# Patient Record
Sex: Male | Born: 1990 | Race: White | Hispanic: No | Marital: Single | Smoking: Never smoker
Health system: Southern US, Community
[De-identification: ages and names within clinical notes are randomized; demographics above are authoritative.]

## PROBLEM LIST (undated history)

## (undated) HISTORY — PX: KNEE SURGERY: SHX244

---
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
## 2011-05-30 ENCOUNTER — Ambulatory Visit: Payer: 59 | Admitting: Family Medicine

## 2011-05-30 ENCOUNTER — Ambulatory Visit: Payer: 59

## 2011-05-30 VITALS — BP 116/67 | HR 88 | Temp 98.5°F | Resp 18 | Ht 68.5 in | Wt 204.0 lb

## 2011-05-30 DIAGNOSIS — K5900 Constipation, unspecified: Secondary | ICD-10-CM

## 2011-05-30 DIAGNOSIS — J029 Acute pharyngitis, unspecified: Secondary | ICD-10-CM

## 2011-05-30 DIAGNOSIS — R1084 Generalized abdominal pain: Secondary | ICD-10-CM

## 2011-05-30 DIAGNOSIS — K219 Gastro-esophageal reflux disease without esophagitis: Secondary | ICD-10-CM

## 2011-05-30 DIAGNOSIS — R1310 Dysphagia, unspecified: Secondary | ICD-10-CM

## 2011-05-30 DIAGNOSIS — R110 Nausea: Secondary | ICD-10-CM

## 2011-05-30 LAB — POCT CBC
Granulocyte percent: 65.3 %G (ref 37–80)
HCT, POC: 45.1 % (ref 43.5–53.7)
Hemoglobin: 15 g/dL (ref 14.1–18.1)
Lymph, poc: 2.6 (ref 0.6–3.4)
MCH, POC: 30.7 pg (ref 27–31.2)
MCHC: 33.3 g/dL (ref 31.8–35.4)
MCV: 92.4 fL (ref 80–97)
MID (cbc): 0.5 (ref 0–0.9)
MPV: 8.2 fL (ref 0–99.8)
POC Granulocyte: 5.8 (ref 2–6.9)
POC LYMPH PERCENT: 28.9 %L (ref 10–50)
POC MID %: 5.8 %M (ref 0–12)
Platelet Count, POC: 337 10*3/uL (ref 142–424)
RBC: 4.88 M/uL (ref 4.69–6.13)
RDW, POC: 13 %
WBC: 8.9 10*3/uL (ref 4.6–10.2)

## 2011-05-30 LAB — POCT RAPID STREP A (OFFICE): Rapid Strep A Screen: NEGATIVE

## 2011-05-30 LAB — POCT URINALYSIS DIPSTICK
Bilirubin, UA: NEGATIVE
Blood, UA: NEGATIVE
Glucose, UA: NEGATIVE
Ketones, UA: NEGATIVE
Leukocytes, UA: NEGATIVE
Nitrite, UA: NEGATIVE
Protein, UA: NEGATIVE
Spec Grav, UA: 1.025
Urobilinogen, UA: 1
pH, UA: 7

## 2011-05-30 LAB — POCT UA - MICROSCOPIC ONLY
Bacteria, U Microscopic: NEGATIVE
Casts, Ur, LPF, POC: NEGATIVE
Crystals, Ur, HPF, POC: NEGATIVE
Epithelial cells, urine per micros: NEGATIVE
Yeast, UA: NEGATIVE

## 2011-05-30 MED ORDER — OMEPRAZOLE 40 MG PO CPDR: 40.0000 mg | DELAYED_RELEASE_CAPSULE | Freq: Every day | ORAL | Status: DC

## 2011-05-30 NOTE — Patient Instructions (Addendum)
Take MiraLax as instructed until the bowels get on the loose side.  If you continue to not feel any better over the next week or 10 days, or are. worse at any time, return to the office.  If the throat is continuing to bother you try taking Claritin or Allegra.  Constipation in Adults Constipation is having fewer than 2 bowel movements per week. Usually, the stools are hard. As we grow older, constipation is more common. If you try to fix constipation with laxatives, the problem may get worse. This is because laxatives taken over a long period of time make the colon muscles weaker. A low-fiber diet, not taking in enough fluids, and taking some medicines may make these problems worse. MEDICATIONS THAT MAY CAUSE CONSTIPATION  Water pills (diuretics).   Calcium channel blockers (used to control blood pressure and for the heart).   Certain pain medicines (narcotics).   Anticholinergics.   Anti-inflammatory agents.   Antacids that contain aluminum.  DISEASES THAT CONTRIBUTE TO CONSTIPATION  Diabetes.   Parkinson's disease.   Dementia.   Stroke.   Depression.   Illnesses that cause problems with salt and water metabolism.  HOME CARE INSTRUCTIONS   Constipation is usually best cared for without medicines. Increasing dietary fiber and eating more fruits and vegetables is the best way to manage constipation.   Slowly increase fiber intake to 25 to 38 grams per day. Whole grains, fruits, vegetables, and legumes are good sources of fiber. A dietitian can further help you incorporate high-fiber foods into your diet.   Drink enough water and fluids to keep your urine clear or pale yellow.   A fiber supplement may be added to your diet if you cannot get enough fiber from foods.   Increasing your activities also helps improve regularity.   Suppositories, as suggested by your caregiver, will also help. If you are using antacids, such as aluminum or calcium containing products, it will  be helpful to switch to products containing magnesium if your caregiver says it is okay.   If you have been given a liquid injection (enema) today, this is only a temporary measure. It should not be relied on for treatment of longstanding (chronic) constipation.   Stronger measures, such as magnesium sulfate, should be avoided if possible. This may cause uncontrollable diarrhea. Using magnesium sulfate may not allow you time to make it to the bathroom.  SEEK IMMEDIATE MEDICAL CARE IF:   There is bright red blood in the stool.   The constipation stays for more than 4 days.   There is belly (abdominal) or rectal pain.   You do not seem to be getting better.   You have any questions or concerns.  MAKE SURE YOU:   Understand these instructions.   Will watch your condition.   Will get help right away if you are not doing well or get worse.  Document Released: 11/02/2003 Document Revised: 01/23/2011 Document Reviewed: 01/07/2011 [facility] Patient Information 2012 [place], [place].

## 2011-05-30 NOTE — Progress Notes (Signed)
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 It is a generalized with episodic flares discomfort of the whole abdomen. He gets nauseated some. He his bowels generally acts normally. He saw some blood back in about September. Has not had any more of that. He eats a fast food diet primarily. He does get some exercise with walking. He is not sexually involved. Has no cardiorespiratory symptoms. No GU symptoms. During the past week he has had a somewhat different problem with just difficulty with feeling bloated after eating and a somewhat nonspecific discomfort. He has had some soreness in the right side of his throat over the last week it makes it hard to swallow. Mild erythema right tonsil  Objective: White male in no acute distress at this time chest clear. Heart regular. Abdomen had normal bowel sounds soft without organomegaly masses or discrete tenderness. He has a vague discomfort and tenderness in the left and mid abdomen. Although there were no masses on palpation the abdomen does feel full.  Assessment: Abdominal pain, etiology unclear Sore throat  Plan: CBC, CMeT., U A., abdominal x-ray, strep screen  Results for orders placed in visit on 05/30/11  POCT CBC      Component Value Range   WBC 8.9  4.6 - 10.2 (K/uL)   Lymph, poc 2.6  0.6 - 3.4    POC LYMPH PERCENT 28.9  10 - 50 (%L)   MID (cbc) 0.5  0 - 0.9    POC MID % 5.8  0 - 12 (%M)   POC Granulocyte 5.8  2 - 6.9    Granulocyte percent 65.3  37 - 80 (%G)   RBC 4.88  4.69 - 6.13 (M/uL)   Hemoglobin 15.0  14.1 - 18.1 (g/dL)   HCT, POC 16.1  09.6 - 53.7 (%)   MCV 92.4  80 - 97 (fL)   MCH, POC 30.7  27 - 31.2 (pg)   MCHC 33.3  31.8 - 35.4 (g/dL)   RDW, POC 04.5     Platelet Count, POC 337  142 - 424 (K/uL)   MPV 8.2  0 - 99.8 (fL)  POCT URINALYSIS DIPSTICK      Component Value Range   Color, UA yellow     Clarity, UA clear     Glucose, UA neg     Bilirubin, UA  neg     Ketones, UA neg     Spec Grav, UA 1.025     Blood, UA neg     pH, UA 7.0     Protein, UA neg     Urobilinogen, UA 1.0     Nitrite, UA neg     Leukocytes, UA Negative    POCT UA - MICROSCOPIC ONLY      Component Value Range   WBC, Ur, HPF, POC 0-2     RBC, urine, microscopic 0-1     Bacteria, U Microscopic neg     Mucus, UA moderate     Epithelial cells, urine per micros neg     Crystals, Ur, HPF, POC neg     Casts, Ur, LPF, POC neg     Yeast, UA neg    POCT RAPID STREP A (OFFICE)      Component Value Range   Rapid Strep A Screen Negative  Negative    [facility] reading (PRIMARY) by  Dr. Alwyn Ren Nonspecific abdomen  .

## 2011-05-31 LAB — COMPREHENSIVE METABOLIC PANEL
ALT: 17 U/L (ref 0–53)
AST: 16 U/L (ref 0–37)
Albumin: 4.6 g/dL (ref 3.5–5.2)
Alkaline Phosphatase: 97 U/L (ref 39–117)
BUN: 8 mg/dL (ref 6–23)
CO2: 27 mEq/L (ref 19–32)
Calcium: 9.9 mg/dL (ref 8.4–10.5)
Chloride: 106 mEq/L (ref 96–112)
Creat: 0.97 mg/dL (ref 0.50–1.35)
Glucose, Bld: 90 mg/dL (ref 70–99)
Potassium: 4 mEq/L (ref 3.5–5.3)
Sodium: 142 mEq/L (ref 135–145)
Total Bilirubin: 0.4 mg/dL (ref 0.3–1.2)
Total Protein: 7.4 g/dL (ref 6.0–8.3)

## 2011-06-03 ENCOUNTER — Encounter: Payer: Self-pay | Admitting: *Deleted

## 2012-06-30 ENCOUNTER — Ambulatory Visit: Payer: 59 | Admitting: Family Medicine

## 2012-06-30 VITALS — BP 122/80 | HR 69 | Temp 98.2°F | Resp 12 | Ht 69.0 in | Wt 205.0 lb

## 2012-06-30 DIAGNOSIS — M25561 Pain in right knee: Secondary | ICD-10-CM

## 2012-06-30 DIAGNOSIS — M79609 Pain in unspecified limb: Secondary | ICD-10-CM

## 2012-06-30 NOTE — Progress Notes (Signed)
22 year old man with one year of right knee pain following injury playing ultimate Frisbee. He was initially evaluated and told that the knee would resolve without further intervention, but he states continues to have pain particularly when bending the knee and activities such as going up and down ladders. 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  Objective: Patient has full range of motion although he is cautious about bending his knee. He has a moderate effusion of the right knee, no tenderness, no ecchymosis, moderate anterior drawer laxity, negative grind test Aref assessment: I suspect internal derangement right knee continues to be further imaged to determine exact of her structures are involved.  Plan: MRI right knee   Signed, Elvina Sidle

## 2012-06-30 NOTE — Patient Instructions (Signed)
We are going  To get an MRI to better image the inside of the right  knee

## 2012-07-01 ENCOUNTER — Telehealth: Payer: Self-pay

## 2012-07-01 NOTE — Telephone Encounter (Signed)
Authorization -- [id number] -- for MRI R knee

## 2012-07-01 NOTE — Telephone Encounter (Signed)
Peer-to-peer needed for pt: MRI lower ext joint w/o contrast, case #[id number]. Subscriber ID# [id number], phone # [phone], opt 3. Needs to be done by 07/03/12.

## 2013-04-09 ENCOUNTER — Ambulatory Visit: Payer: 59 | Admitting: Emergency Medicine

## 2013-04-09 VITALS — BP 110/72 | HR 70 | Temp 97.9°F | Resp 18 | Ht 69.5 in | Wt 194.0 lb

## 2013-04-09 DIAGNOSIS — J018 Other acute sinusitis: Secondary | ICD-10-CM

## 2013-04-09 MED ORDER — AMOXICILLIN-POT CLAVULANATE 875-125 MG PO TABS: 1.0000 | ORAL_TABLET | Freq: Two times a day (BID) | ORAL | Status: DC

## 2013-04-09 MED ORDER — PSEUDOEPHEDRINE-GUAIFENESIN ER 60-600 MG PO TB12: 1.0000 | ORAL_TABLET | Freq: Two times a day (BID) | ORAL | Status: DC

## 2013-04-09 NOTE — Progress Notes (Signed)
[facility] [address], [place] [address] [phone]  Date:  04/09/2013   Name:  Hayden HansenSean M Brown   DOB:  12/24/1990   MRN:  [id number]  PCP:  No primary provider on file.    Chief Complaint: Nasal Congestion, Headache, Dizziness and dry mouth   History of Present Illness:  Hayden HansenSean M Brown is a 23 y.o. very pleasant male patient who presents with the following:  Ill since Wednesday with nasal congestion and purulent drainage.  Has a sore throat and a cough productive of purulent sputum. No fever or chills.  Has a frontal headache and no neuro or visual symptoms.  Cough worse at night.  No improvement with over the counter medications or other home remedies. Denies other complaint or health concern today.   There are no active problems to display for this patient.   History reviewed. No pertinent past medical history.  Past Surgical History  Procedure Laterality Date  . Knee surgery Right     History  Substance Use Topics  . Smoking status: Never Smoker   . Smokeless tobacco: Not on file  . Alcohol Use: No    History reviewed. No pertinent family history.  No Known Allergies  Medication list has been reviewed and updated.  Current Outpatient Prescriptions on File Prior to Visit  Medication Sig Dispense Refill  . omeprazole (PRILOSEC) 40 MG capsule Take 1 capsule (40 mg total) by mouth daily.  30 capsule  3   No current facility-administered medications on file prior to visit.    Review of Systems:  As per HPI, otherwise negative.    Physical Examination: Filed Vitals:   04/09/13 1357  BP: 110/72  Pulse: 70  Temp: 97.9 F (36.6 C)  Resp: 18   Filed Vitals:   04/09/13 1357  Height: 5' 9.5" (1.765 m)  Weight: 194 lb (87.998 kg)   Body mass index is 28.25 kg/(m^2). Ideal Body Weight: Weight in (lb) to have BMI = 25: 171.4  GEN: WDWN, NAD, Non-toxic, A & O x 3 HEENT: Atraumatic, Normocephalic. Neck supple. No masses, No LAD. Ears  and Nose: No external deformity. CV: RRR, No M/G/R. No JVD. No thrill. No extra heart sounds. PULM: CTA B, no wheezes, crackles, rhonchi. No retractions. No resp. distress. No accessory muscle use. ABD: S, NT, ND, +BS. No rebound. No HSM. EXTR: No c/c/e NEURO Normal gait.  PSYCH: Normally interactive. Conversant. Not depressed or anxious appearing.  Calm demeanor.    Assessment and Plan: Sinusitis Augmentin  mucinex d  Signed,  Phillips OdorJeffery Christabel Camire, MD

## 2013-04-09 NOTE — Patient Instructions (Signed)

## 2013-09-07 ENCOUNTER — Ambulatory Visit (INDEPENDENT_AMBULATORY_CARE_PROVIDER_SITE_OTHER): Payer: 59 | Admitting: Family Medicine

## 2013-09-07 VITALS — BP 102/70 | HR 56 | Temp 98.1°F | Resp 16 | Ht 68.0 in | Wt 187.6 lb

## 2013-09-07 DIAGNOSIS — L299 Pruritus, unspecified: Secondary | ICD-10-CM

## 2013-09-07 MED ORDER — PREDNISONE 20 MG PO TABS: ORAL_TABLET | ORAL | Status: DC

## 2013-09-07 NOTE — Progress Notes (Signed)
[facility] [address], [place] [address] [phone]  Date:  09/07/2013   Name:  Hayden Brown   DOB:  10/09/1990   MRN:  [id number]  PCP:  No PCP Per Patient    Chief Complaint: Rash   History of Present Illness:  Hayden Brown is a 23 y.o. very pleasant male patient who presents with the following:  He is here today with itching for the last 5-6 days.  He did do some work on a roof the day prior to this starting- he got some grit from the shingles on his skin but washed it off carefully. Otherwise he is not aware of any exposure to new foods, medications or other substances.  He otherwise feels well and is generally healthy.  He has not tried any medication for his itching.  He has noted some rash but not much   There are no active problems to display for this patient.   History reviewed. No pertinent past medical history.  Past Surgical History  Procedure Laterality Date  . Knee surgery Right     History  Substance Use Topics  . Smoking status: Never Smoker   . Smokeless tobacco: Not on file  . Alcohol Use: No    History reviewed. No pertinent family history.  No Known Allergies  Medication list has been reviewed and updated.  Current Outpatient Prescriptions on File Prior to Visit  Medication Sig Dispense Refill  . amoxicillin-clavulanate (AUGMENTIN) 875-125 MG per tablet Take 1 tablet by mouth 2 (two) times daily.  20 tablet  0  . omeprazole (PRILOSEC) 40 MG capsule Take 1 capsule (40 mg total) by mouth daily.  30 capsule  3  . pseudoephedrine-guaifenesin (MUCINEX D) 60-600 MG per tablet Take 1 tablet by mouth every 12 (twelve) hours.  18 tablet  0   No current facility-administered medications on file prior to visit.    Review of Systems:  As per HPI- otherwise negative.   Physical Examination: Filed Vitals:   09/07/13 1513  BP: 102/70  Pulse: 56  Temp: 98.1 F (36.7 C)  Resp: 16   Filed Vitals:   09/07/13 1513  Height:  5\' 8"  (1.727 m)  Weight: 187 lb 9.6 oz (85.095 kg)   Body mass index is 28.53 kg/(m^2). Ideal Body Weight: Weight in (lb) to have BMI = 25: 164.1  GEN: WDWN, NAD, Non-toxic, A & O x 3, looks well HEENT: Atraumatic, Normocephalic. Neck supple. No masses, No LAD.  No oral lesions  Ears and Nose: No external deformity. CV: RRR, No M/G/R. No JVD. No thrill. No extra heart sounds. PULM: CTA B, no wheezes, crackles, rhonchi. No retractions. No resp. distress. No accessory muscle use. ABD: S, NT, ND EXTR: No c/c/e NEURO Normal gait.  PSYCH: Normally interactive. Conversant. Not depressed or anxious appearing.  Calm demeanor.  He has a mild rash on his chest that appears irritated; discrete papules which blanche easily.     Assessment and Plan: Itching - Plan: predniSONE (DELTASONE) 20 MG tablet, Comprehensive metabolic panel  Probably allergic dermatitis.  Will treat with a short course of prednisone, check CMP.  Antihistamines as needed.  Follow- up if not better See patient instructions for more details.     Signed Abbe AmsterdamJessica Copland, MD

## 2013-09-07 NOTE — Patient Instructions (Signed)
We are going to treat your itching with prednisone- use as directed for 6 days.  You might try claritin during the day, and benadryl at night/ when you do not need to drive.  I will be in touch with your labs asap- let me know if you are not improved in 36- 48 hours/

## 2013-09-08 ENCOUNTER — Encounter: Payer: Self-pay | Admitting: Family Medicine

## 2013-09-08 LAB — COMPREHENSIVE METABOLIC PANEL
ALK PHOS: 99 U/L (ref 39–117)
ALT: 16 U/L (ref 0–53)
AST: 14 U/L (ref 0–37)
Albumin: 4.8 g/dL (ref 3.5–5.2)
BILIRUBIN TOTAL: 0.7 mg/dL (ref 0.2–1.2)
BUN: 12 mg/dL (ref 6–23)
CO2: 25 meq/L (ref 19–32)
Calcium: 9.9 mg/dL (ref 8.4–10.5)
Chloride: 101 mEq/L (ref 96–112)
Creat: 0.84 mg/dL (ref 0.50–1.35)
Glucose, Bld: 87 mg/dL (ref 70–99)
Potassium: 4.2 mEq/L (ref 3.5–5.3)
SODIUM: 136 meq/L (ref 135–145)
TOTAL PROTEIN: 7.7 g/dL (ref 6.0–8.3)

## 2014-06-21 ENCOUNTER — Ambulatory Visit (INDEPENDENT_AMBULATORY_CARE_PROVIDER_SITE_OTHER): Payer: 59

## 2014-06-21 ENCOUNTER — Ambulatory Visit (INDEPENDENT_AMBULATORY_CARE_PROVIDER_SITE_OTHER): Payer: 59 | Admitting: Internal Medicine

## 2014-06-21 ENCOUNTER — Telehealth: Payer: Self-pay | Admitting: Radiology

## 2014-06-21 VITALS — BP 106/58 | HR 74 | Temp 97.8°F | Resp 15 | Ht 69.25 in | Wt 195.4 lb

## 2014-06-21 DIAGNOSIS — R070 Pain in throat: Secondary | ICD-10-CM | POA: Diagnosis not present

## 2014-06-21 DIAGNOSIS — R05 Cough: Secondary | ICD-10-CM

## 2014-06-21 DIAGNOSIS — R0781 Pleurodynia: Secondary | ICD-10-CM

## 2014-06-21 DIAGNOSIS — J3089 Other allergic rhinitis: Secondary | ICD-10-CM

## 2014-06-21 DIAGNOSIS — R059 Cough, unspecified: Secondary | ICD-10-CM

## 2014-06-21 LAB — POCT CBC
Granulocyte percent: 58.7 %G (ref 37–80)
HCT, POC: 46.4 % (ref 43.5–53.7)
HEMOGLOBIN: 14.7 g/dL (ref 14.1–18.1)
LYMPH, POC: 2.1 (ref 0.6–3.4)
MCH, POC: 29.5 pg (ref 27–31.2)
MCHC: 31.8 g/dL (ref 31.8–35.4)
MCV: 92.9 fL (ref 80–97)
MID (cbc): 0.5 (ref 0–0.9)
MPV: 7.4 fL (ref 0–99.8)
POC Granulocyte: 3.6 (ref 2–6.9)
POC LYMPH PERCENT: 34 %L (ref 10–50)
POC MID %: 7.3 %M (ref 0–12)
Platelet Count, POC: 300 10*3/uL (ref 142–424)
RBC: 4.99 M/uL (ref 4.69–6.13)
RDW, POC: 13.6 %
WBC: 6.2 10*3/uL (ref 4.6–10.2)

## 2014-06-21 MED ORDER — HYDROCODONE-ACETAMINOPHEN 7.5-325 MG/15ML PO SOLN: 10.0000 mL | Freq: Four times a day (QID) | ORAL | Status: DC | PRN

## 2014-06-21 MED ORDER — CETIRIZINE HCL 10 MG PO TABS: 10.0000 mg | ORAL_TABLET | Freq: Every day | ORAL | Status: DC

## 2014-06-21 MED ORDER — AZITHROMYCIN 500 MG PO TABS: 500.0000 mg | ORAL_TABLET | Freq: Every day | ORAL | Status: DC

## 2014-06-21 NOTE — Patient Instructions (Addendum)
Allergic Rhinitis Allergic rhinitis is when the mucous membranes in the nose respond to allergens. Allergens are particles in the air that cause your body to have an allergic reaction. This causes you to release allergic antibodies. Through a chain of events, these eventually cause you to release histamine into the blood stream. Although meant to protect the body, it is this release of histamine that causes your discomfort, such as frequent sneezing, congestion, and an itchy, runny nose.  CAUSES  Seasonal allergic rhinitis (hay fever) is caused by pollen allergens that may come from grasses, trees, and weeds. Year-round allergic rhinitis (perennial allergic rhinitis) is caused by allergens such as house dust mites, pet dander, and mold spores.  SYMPTOMS   Nasal stuffiness (congestion).  Itchy, runny nose with sneezing and tearing of the eyes. DIAGNOSIS  Your health care provider can help you determine the allergen or allergens that trigger your symptoms. If you and your health care provider are unable to determine the allergen, skin or blood testing may be used. TREATMENT  Allergic rhinitis does not have a cure, but it can be controlled by:  Medicines and allergy shots (immunotherapy).  Avoiding the allergen. Hay fever may often be treated with antihistamines in pill or nasal spray forms. Antihistamines block the effects of histamine. There are over-the-counter medicines that may help with nasal congestion and swelling around the eyes. Check with your health care provider before taking or giving this medicine.  If avoiding the allergen or the medicine prescribed do not work, there are many new medicines your health care provider can prescribe. Stronger medicine may be used if initial measures are ineffective. Desensitizing injections can be used if medicine and avoidance does not work. Desensitization is when a patient is given ongoing shots until the body becomes less sensitive to the allergen.  Make sure you follow up with your health care provider if problems continue. HOME CARE INSTRUCTIONS It is not possible to completely avoid allergens, but you can reduce your symptoms by taking steps to limit your exposure to them. It helps to know exactly what you are allergic to so that you can avoid your specific triggers. SEEK MEDICAL CARE IF:   You have a fever.  You develop a cough that does not stop easily (persistent).  You have shortness of breath.  You start wheezing.  Symptoms interfere with normal daily activities. Document Released: 10/29/2000 Document Revised: 02/08/2013 Document Reviewed: 10/11/2012 [facility] Patient Information 2015 [place], [place]. This information is not intended to replace advice given to you by your health care provider. Make sure you discuss any questions you have with your health care provider. Chest Pain (Nonspecific) It is often hard to give a specific diagnosis for the cause of chest pain. There is always a chance that your pain could be related to something serious, such as a heart attack or a blood clot in the lungs. You need to follow up with your health care provider for further evaluation. CAUSES   Heartburn.  Pneumonia or bronchitis.  Anxiety or stress.  Inflammation around your heart (pericarditis) or lung (pleuritis or pleurisy).  A blood clot in the lung.  A collapsed lung (pneumothorax). It can develop suddenly on its own (spontaneous pneumothorax) or from trauma to the chest.  Shingles infection (herpes zoster virus). The chest wall is composed of bones, muscles, and cartilage. Any of these can be the source of the pain.  The bones can be bruised by injury.  The muscles or cartilage can be strained  by coughing or overwork.  The cartilage can be affected by inflammation and become sore (costochondritis). DIAGNOSIS  Lab tests or other studies may be needed to find the cause of your pain. Your health care provider may have you  take a test called an ambulatory electrocardiogram (ECG). An ECG records your heartbeat patterns over a 24-hour period. You may also have other tests, such as:  Transthoracic echocardiogram (TTE). During echocardiography, sound waves are used to evaluate how blood flows through your heart.  Transesophageal echocardiogram (TEE).  Cardiac monitoring. This allows your health care provider to monitor your heart rate and rhythm in real time.  Holter monitor. This is a portable device that records your heartbeat and can help diagnose heart arrhythmias. It allows your health care provider to track your heart activity for several days, if needed.  Stress tests by exercise or by giving medicine that makes the heart beat faster. TREATMENT   Treatment depends on what may be causing your chest pain. Treatment may include:  Acid blockers for heartburn.  Anti-inflammatory medicine.  Pain medicine for inflammatory conditions.  Antibiotics if an infection is present.  You may be advised to change lifestyle habits. This includes stopping smoking and avoiding alcohol, caffeine, and chocolate.  You may be advised to keep your head raised (elevated) when sleeping. This reduces the chance of acid going backward from your stomach into your esophagus. Most of the time, nonspecific chest pain will improve within 2-3 days with rest and mild pain medicine.  HOME CARE INSTRUCTIONS   If antibiotics were prescribed, take them as directed. Finish them even if you start to feel better.  For the next few days, avoid physical activities that bring on chest pain. Continue physical activities as directed.  Do not use any tobacco products, including cigarettes, chewing tobacco, or electronic cigarettes.  Avoid drinking alcohol.  Only take medicine as directed by your health care provider.  Follow your health care provider's suggestions for further testing if your chest pain does not go away.  Keep any follow-up  appointments you made. If you do not go to an appointment, you could develop lasting (chronic) problems with pain. If there is any problem keeping an appointment, call to reschedule. SEEK MEDICAL CARE IF:   Your chest pain does not go away, even after treatment.  You have a rash with blisters on your chest.  You have a fever. SEEK IMMEDIATE MEDICAL CARE IF:   You have increased chest pain or pain that spreads to your arm, neck, jaw, back, or abdomen.  You have shortness of breath.  You have an increasing cough, or you cough up blood.  You have severe back or abdominal pain.  You feel nauseous or vomit.  You have severe weakness.  You faint.  You have chills. This is an emergency. Do not wait to see if the pain will go away. Get medical help at once. Call your local emergency services (911 in [place] Do not drive yourself to the hospital. MAKE SURE YOU:   Understand these instructions.  Will watch your condition.  Will get help right away if you are not doing well or get worse. Document Released: 11/13/2004 Document Revised: 02/08/2013 Document Reviewed: 09/09/2007 [facility] Patient Information 2015 [place], [place]. This information is not intended to replace advice given to you by your health care provider. Make sure you discuss any questions you have with your health care provider.

## 2014-06-21 NOTE — Telephone Encounter (Signed)
Dr Perrin MalteseGuest-- Please see pt's xray report.

## 2014-06-21 NOTE — Progress Notes (Signed)
   Subjective:    Patient ID: Hayden Brown, male    DOB: 05/02/1990, 24 y.o.   MRN: [id number]  HPI Hayden Brown is a 24 y.o. Male presenting today with a cough when he breathes deeply which has been present for 2 weeks. He has a tightness in his throat with painful breathing. He also has fatigue and sneezing. His cough is non productive. He denies fever, otalgia, sinus pressure, or wheezing. He also denies an hx of asthma. Hayden Brown states his brother has recently been sick but has had fever with his sickness. Has minimal hx for allergys, does sneeze at [employer] while working. No dizzy, syncope, arm pain, diaphoresis, sob. No hx of heart disease  Review of Systems     Objective:   Physical Exam  Constitutional: He is oriented to person, place, and time. He appears well-developed and well-nourished. No distress.  HENT:  Head: Normocephalic.  Right Ear: External ear normal.  Left Ear: External ear normal.  Nose: Mucosal edema and rhinorrhea present. No sinus tenderness. No epistaxis. Right sinus exhibits no maxillary sinus tenderness and no frontal sinus tenderness. Left sinus exhibits no maxillary sinus tenderness and no frontal sinus tenderness.  Mouth/Throat: Oropharynx is clear and moist.  Eyes: Conjunctivae and EOM are normal. Pupils are equal, round, and reactive to light.  Neck: Normal range of motion. No tracheal deviation present. No thyromegaly present.  Cardiovascular: Normal rate.   Pulmonary/Chest: Effort normal and breath sounds normal. No respiratory distress. He has no wheezes. He has no rales. He exhibits tenderness.  Abdominal: Soft.  Musculoskeletal: Normal range of motion.  Lymphadenopathy:    He has no cervical adenopathy.  Neurological: He is alert and oriented to person, place, and time. He exhibits normal muscle tone. Coordination normal.  Psychiatric: He has a normal mood and affect.   [facility] reading (PRIMARY) by  Dr.Guest normal cxr   Results for orders placed or performed in  visit on 06/21/14  POCT CBC  Result Value Ref Range   WBC 6.2 4.6 - 10.2 K/uL   Lymph, poc 2.1 0.6 - 3.4   POC LYMPH PERCENT 34.0 10 - 50 %L   MID (cbc) 0.5 0 - 0.9   POC MID % 7.3 0 - 12 %M   POC Granulocyte 3.6 2 - 6.9   Granulocyte percent 58.7 37 - 80 %G   RBC 4.99 4.69 - 6.13 M/uL   Hemoglobin 14.7 14.1 - 18.1 g/dL   HCT, POC 82.946.4 56.243.5 - 53.7 %   MCV 92.9 80 - 97 fL   MCH, POC 29.5 27 - 31.2 pg   MCHC 31.8 31.8 - 35.4 g/dL   RDW, POC 13.013.6 %   Platelet Count, POC 300 142 - 424 K/uL   MPV 7.4 0 - 99.8 fL           Assessment & Plan:  Cough/Pleuritic chest pain Zithromax/Lortab RTC Friday reasses/consider GERD

## 2014-06-23 NOTE — Telephone Encounter (Signed)
Please have mr. Stys rtc for repeat cxr with nipple markers

## 2014-06-23 NOTE — Telephone Encounter (Signed)
Pt notified. Will return for repeat cxr with nipple markers.

## 2014-06-24 ENCOUNTER — Ambulatory Visit (INDEPENDENT_AMBULATORY_CARE_PROVIDER_SITE_OTHER): Payer: 59

## 2014-06-24 ENCOUNTER — Ambulatory Visit (INDEPENDENT_AMBULATORY_CARE_PROVIDER_SITE_OTHER): Payer: 59 | Admitting: Family Medicine

## 2014-06-24 VITALS — BP 110/60 | HR 77 | Temp 97.8°F | Resp 16 | Ht 69.0 in | Wt 196.0 lb

## 2014-06-24 DIAGNOSIS — R938 Abnormal findings on diagnostic imaging of other specified body structures: Secondary | ICD-10-CM | POA: Diagnosis not present

## 2014-06-24 DIAGNOSIS — R9389 Abnormal findings on diagnostic imaging of other specified body structures: Secondary | ICD-10-CM

## 2014-06-24 NOTE — Progress Notes (Signed)
[facility] [address], [place] [address] [phone]  Date:  06/24/2014   Name:  Hayden Brown   DOB:  03/18/1990   MRN:  [id number]  PCP:  No PCP Per Patient    Chief Complaint: Follow-up   History of Present Illness:  Hayden Brown is a 24 y.o. very pleasant male patient who presents with the following:  Here today seeking a repeat CXR- he had the following film when he was seen here 3 days ago and was called for a repeat.    CHEST 2 VIEW  COMPARISON: None.  FINDINGS: The heart size and mediastinal contours are within normal limits. Mild central peribronchial thickening noted bilaterally. No evidence of pulmonary airspace disease or edema. No evidence of pleural effusion or pneumothorax. A nodular opacity seen overlying the right lung base on the frontal projection, which may represent a nipple shadow although a pulmonary neoplasm cannot definitely be excluded.  IMPRESSION: Central peribronchial thickening. No evidence of pneumonia or congestive heart failure.  Asymmetric right lower lung nodular opacity, which may represent lung nodule versus nipple shadow. Followup PA chest radiograph with nipple markers is recommended.  These results will be called to the ordering clinician or representative by the Radiologist Assistant, and communication documented in the PACS or zVision Dashboard.   There are no active problems to display for this patient.   History reviewed. No pertinent past medical history.  Past Surgical History  Procedure Laterality Date  . Knee surgery Right     History  Substance Use Topics  . Smoking status: Never Smoker   . Smokeless tobacco: Not on file  . Alcohol Use: No    History reviewed. No pertinent family history.  No Known Allergies  Medication list has been reviewed and updated.  Current Outpatient Prescriptions on File Prior to Visit  Medication Sig Dispense Refill  . azithromycin  (ZITHROMAX) 500 MG tablet Take 1 tablet (500 mg total) by mouth daily. 5 tablet 0  . cetirizine (ZYRTEC) 10 MG tablet Take 1 tablet (10 mg total) by mouth daily. 30 tablet 11  . HYDROcodone-acetaminophen (HYCET) 7.5-325 mg/15 ml solution Take 10-15 mLs by mouth every 6 (six) hours as needed. 120 mL 0   No current facility-administered medications on file prior to visit.    Review of Systems:  As per HPI- otherwise negative.   Physical Examination: Filed Vitals:   06/24/14 1323  BP: 110/60  Pulse: 77  Temp: 97.8 F (36.6 C)  Resp: 16   Filed Vitals:   06/24/14 1323  Height: 5\' 9"  (1.753 m)  Weight: 196 lb (88.905 kg)   Body mass index is 28.93 kg/(m^2). Ideal Body Weight: Weight in (lb) to have BMI = 25: 168.9  GEN: WDWN, NAD, Non-toxic, A & O x 3, looks well HEENT: Atraumatic, Normocephalic. Neck supple. No masses, No LAD. Ears and Nose: No external deformity. CV: RRR, No M/G/R. No JVD. No thrill. No extra heart sounds. PULM: CTA B, no wheezes, crackles, rhonchi. No retractions. No resp. distress. No accessory muscle use. EXTR: No c/c/e NEURO Normal gait.  PSYCH: Normally interactive. Conversant. Not depressed or anxious appearing.  Calm demeanor.   [facility] reading (PRIMARY) by  Dr. Patsy Lageropland. CXR: nipple markers used. Nodule noted on CXR earlier this week seems to correspond to nipple marker.    CHEST 1 VIEW  COMPARISON: 06/21/2014.  FINDINGS: Repeat PA views of the chest without and with nipple markers confirmed at the recently  demonstrated nodule overlying the right lower lung zone is the right nipple. There is also a corresponding left nipple shadow today. Normal sized heart. Clear lungs. Normal appearing bones.  IMPRESSION: Normal examination. The recently demonstrated nodular density on the right was the right nipple.  Assessment and Plan: Abnormal CXR - Plan: DG Chest 1 View  Await radiology read but suspect abnormality noted was a nipple shadow.  He  still has some cough but it is getting better.   Encouraged him to wait a bit longer as he is still on abx and I think he will continue to get better.   Called and LMOM on his machine at home with overread report  Signed Abbe AmsterdamJessica Eagan Shifflett, MD

## 2016-11-01 IMAGING — CR DG CHEST 2V
2 series · 2 of 2 positions shown · non-contrast
Comparison: None.

CLINICAL DATA: Cough.  Pleuritic chest pain.

EXAM:
CHEST  2 VIEW

[PA]
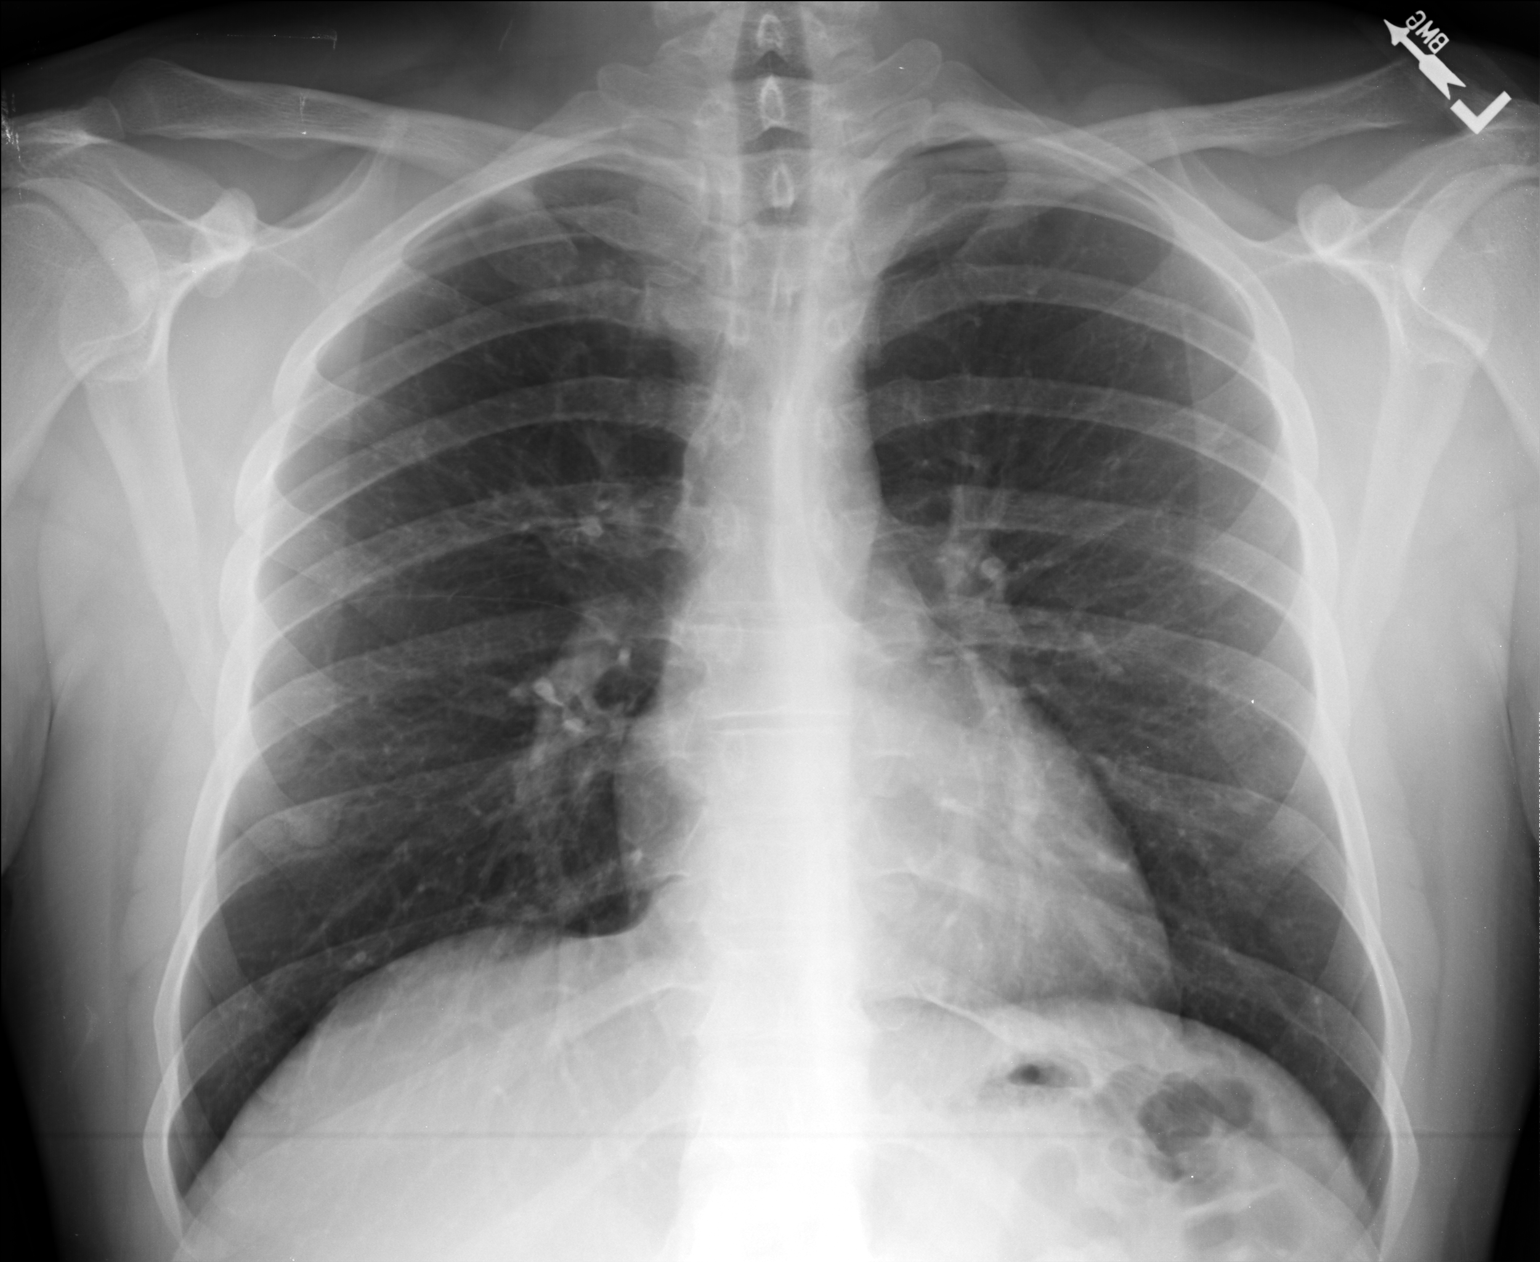

[lateral]
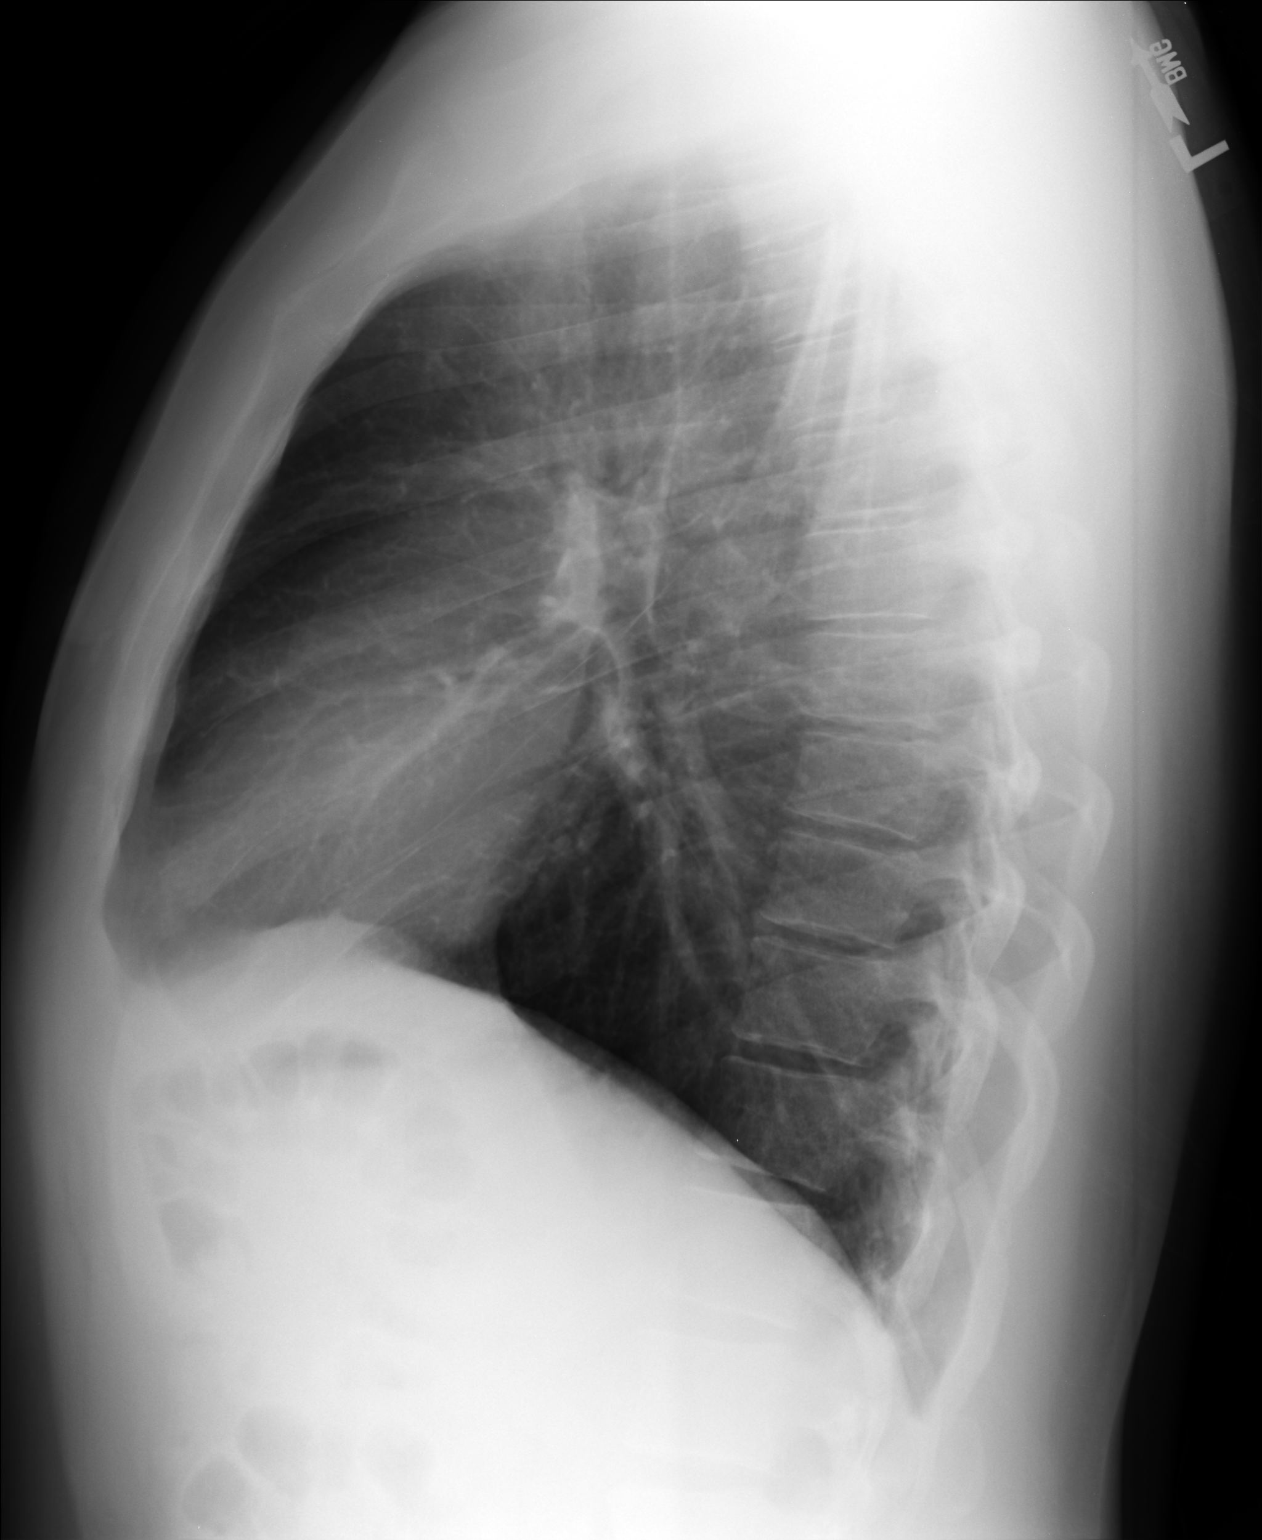

[2 of 2 positions shown; findings below may reference images not displayed]

FINDINGS: The heart size and mediastinal contours are within normal limits.
Mild central peribronchial thickening noted bilaterally. No evidence
of pulmonary airspace disease or edema. No evidence of pleural
effusion or pneumothorax. A nodular opacity seen overlying the right
lung base on the frontal projection, which may represent a nipple
shadow although a pulmonary neoplasm cannot definitely be excluded.
IMPRESSION: Central peribronchial thickening. No evidence of pneumonia or
congestive heart failure.

Asymmetric right lower lung nodular opacity, which may represent
lung nodule versus nipple shadow. Followup PA chest radiograph with
nipple markers is recommended.

These results will be called to the ordering clinician or
representative by the Radiologist Assistant, and communication
documented in the PACS or zVision Dashboard.

## 2016-11-04 IMAGING — CR DG CHEST 1V
3 series · 3 of 3 positions shown · non-contrast
Comparison: 06/21/2014.

CLINICAL DATA: Right lung nodule or nipple on recent chest
radiographs.

EXAM:
CHEST  1 VIEW

[PA (1 of 3)]
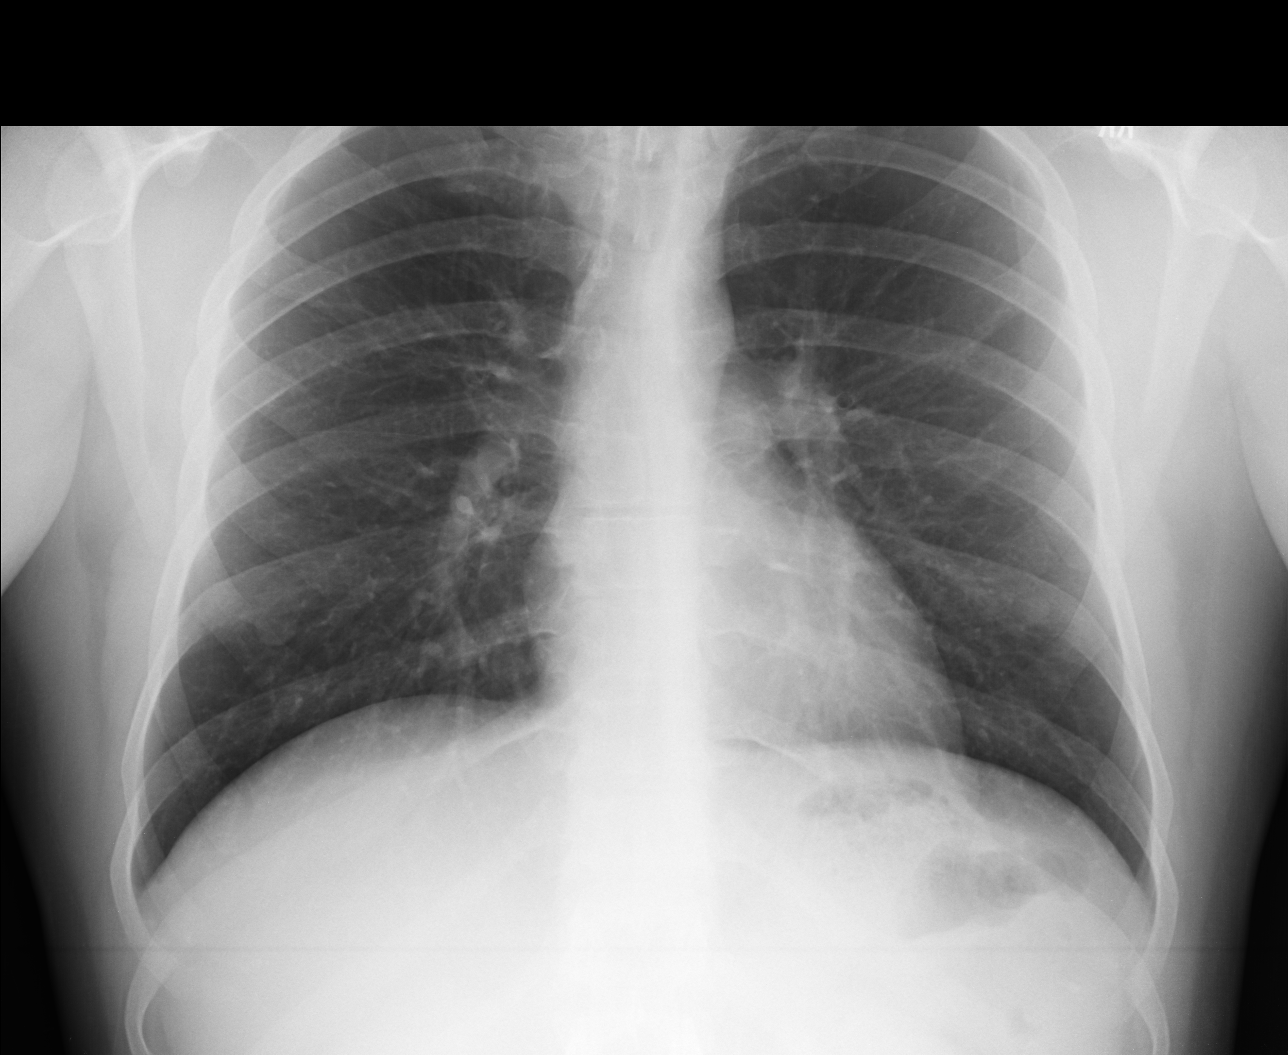

[PA (2 of 3)]
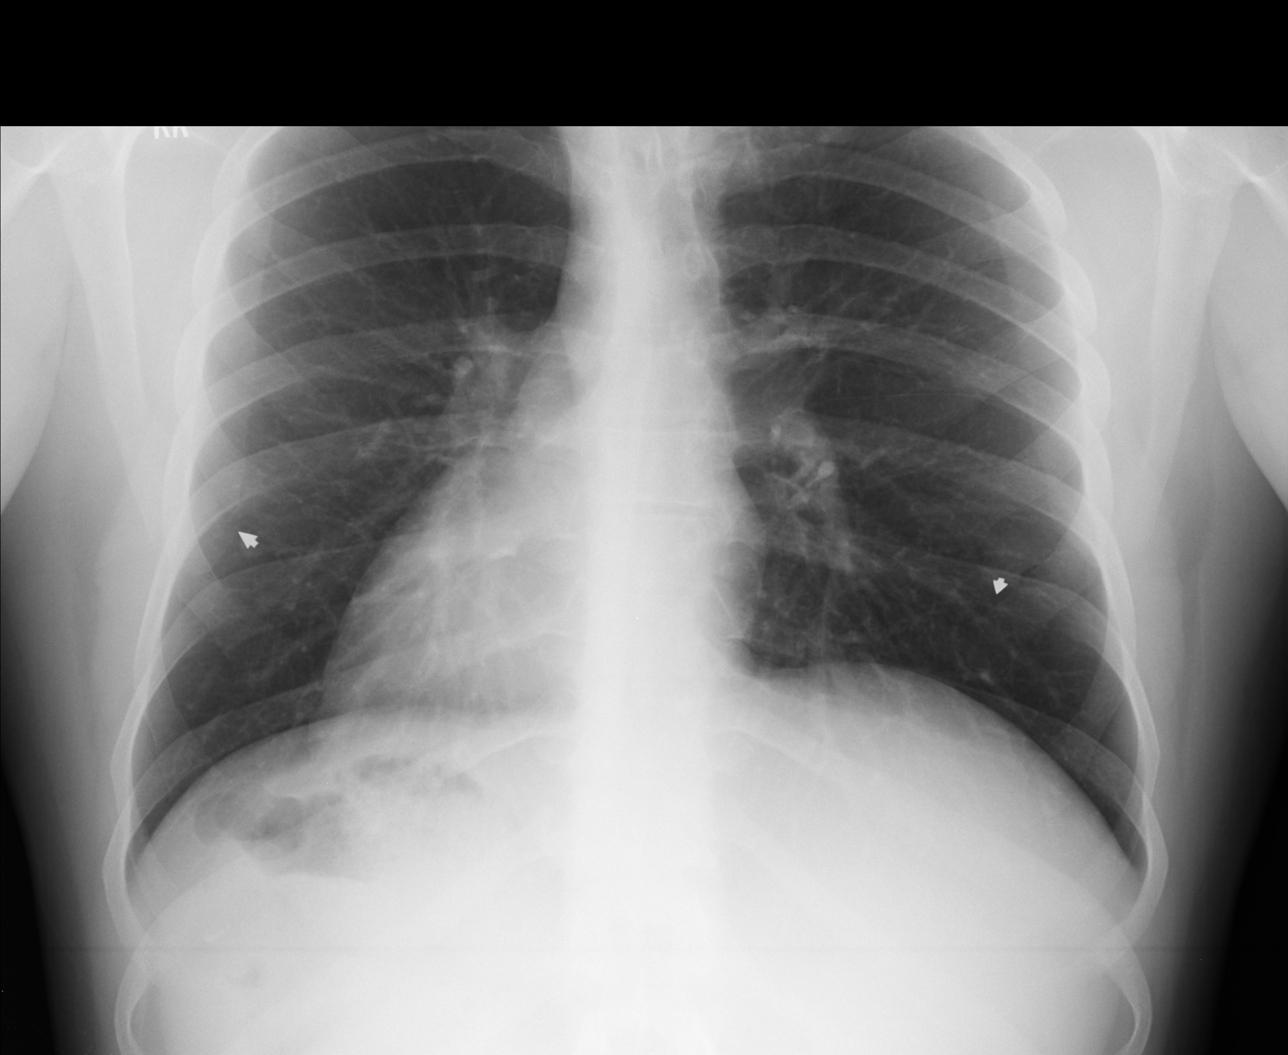

[PA (3 of 3)]
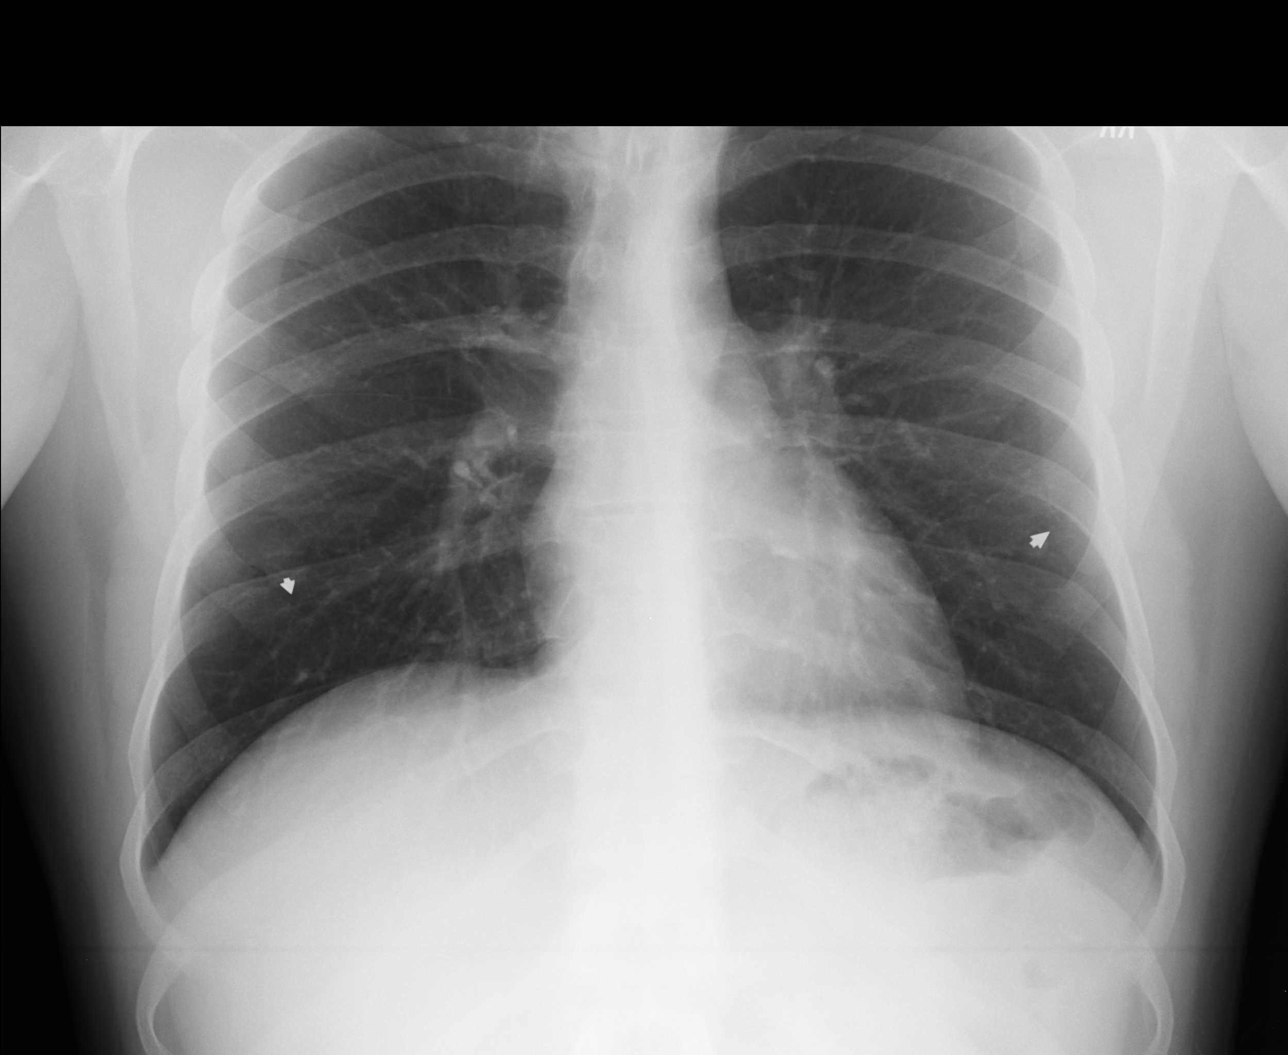

[3 of 3 positions shown; findings below may reference images not displayed]

FINDINGS: Repeat PA views of the chest without and with nipple markers
confirmed at the recently demonstrated nodule overlying the right
lower lung zone is the right nipple. There is also a corresponding
left nipple shadow today. Normal sized heart. Clear lungs. Normal
appearing bones.
IMPRESSION: Normal examination. The recently demonstrated nodular density on the
right was the right nipple.

## 2019-02-16 ENCOUNTER — Encounter: Payer: Self-pay | Admitting: Nurse Practitioner

## 2019-02-27 NOTE — Progress Notes (Addendum)
02/27/2019 Hayden Brown [id number] Jul 17, 1990   HISTORY OF PRESENT ILLNESS: Hayden Brown is a 29 year old male with a past medical history of stomach upset after eating which initially started in middle school.  His stomach upset episodes are intermittent.  He reports spicy or greasy foods worsen his symptoms.  He has infrequent heartburn which typically occurs after eating sugary food.  No dysphagia.  He takes Pepto-Bismol as needed when having stomach discomfort.  He does not take any acid reducing medications.  He specifically describes having left upper quadrant discomfort, no significant pain.  On Christmas and New Year's day he ate a small amount of food but felt as if he ate a large meal resulted in stomach discomfort.  No nausea or vomiting.  He passes 1-2 soft brown stools which followed part in the toilet water daily which he reports is his normal bowel pattern.  No watery diarrhea.  No rectal bleeding or black stools.  He infrequently uses NSAIDs.  Rare alcohol use.  No drug use.  He reported having a temperature of 99.9 approximately 3 days ago with a mild sore throat which has resolved.  No sweats or chills.  No weight loss.  Maternal grandmother with history of colon cancer, the age of her diagnosis is unclear but she was older.  His stress level is somewhat high at work, he is a Designer, jewellery for [employer].   He stated he is not overly concerned with his GI symptoms but he was prompted by his family and friends to undergo a GI evaluation.   No past medical history on file. Past Surgical History:  Procedure Laterality Date  . KNEE SURGERY Right     reports that he has never smoked. He does not have any smokeless tobacco history on file. He reports that he does not drink alcohol or use drugs. family history is not on file. No Known Allergies    Outpatient Encounter Medications as of 02/28/2019  Medication Sig  . azithromycin (ZITHROMAX) 500 MG tablet Take 1 tablet (500 mg total)  by mouth daily.  . cetirizine (ZYRTEC) 10 MG tablet Take 1 tablet (10 mg total) by mouth daily.  Marland Kitchen HYDROcodone-acetaminophen (HYCET) 7.5-325 mg/15 ml solution Take 10-15 mLs by mouth every 6 (six) hours as needed.   No facility-administered encounter medications on file as of 02/28/2019.     REVIEW OF SYSTEMS  : All other systems reviewed and negative except where noted in the History of Present Illness.   PHYSICAL EXAM: BP 100/70   Pulse 91   Temp 98.7 F (37.1 C)   Ht 5\' 10"  (1.778 m)   Wt 212 lb (96.2 kg)   BMI 30.42 kg/m  General: Well developed 29 year old male in no acute distress. Head: Normocephalic and atraumatic. Eyes:  sclerae non-icteric, conjunctive pink. Ears: Normal auditory acuity. Neck: Supple, no lymphadenopathy or thyromegaly.  Lungs: Clear bilaterally to auscultation without wheezes, crackles or rhonchi. Heart: Regular rate and rhythm, no murmur. No rubs or gallops appreciated.  Abdomen: Soft, nontender, non distended. No masses. No hepatosplenomegaly. Normoactive bowel sounds x 4 quadrants.  Rectal: Deferred. Musculoskeletal: Symmetrical with no gross deformities. Skin: Warm and dry. No rash or lesions on visible extremities. Extremities: No edema. Neurological: Alert oriented x 4, no focal deficits.  Psychological:  Alert and cooperative. Normal mood and affect.  ASSESSMENT AND PLAN:  73.  29 year old male with left upper quadrant pain -H. pylori breath test, CBC, CMP,  CRP, TTG and IgA. -Omeprazole 20 mg 1 capsule by mouth to be taken once daily to start after H. pylori breath test completed -I discussed scheduling an EGD if his symptoms persist or worsen -Follow-up in the office in 4 to 6 weeks -GERD handout  2.  Infrequent heartburn -See plan in #1  3.  Altered bowel pattern, soft stools -Patient to call office if his loose stools worsen on PPI   CC:  No ref. provider found   Addendum: Agree with the assessment and plan as outlined by  Hayden Evener, NP.   Hayden Locks, DO, [facility]

## 2019-02-28 ENCOUNTER — Ambulatory Visit: Payer: BC Managed Care – PPO | Admitting: Nurse Practitioner

## 2019-02-28 ENCOUNTER — Encounter: Payer: Self-pay | Admitting: Nurse Practitioner

## 2019-02-28 VITALS — BP 100/70 | HR 91 | Temp 98.7°F | Ht 70.0 in | Wt 212.0 lb

## 2019-02-28 DIAGNOSIS — R12 Heartburn: Secondary | ICD-10-CM | POA: Diagnosis not present

## 2019-02-28 DIAGNOSIS — R198 Other specified symptoms and signs involving the digestive system and abdomen: Secondary | ICD-10-CM

## 2019-02-28 DIAGNOSIS — R194 Change in bowel habit: Secondary | ICD-10-CM | POA: Diagnosis not present

## 2019-02-28 DIAGNOSIS — R1012 Left upper quadrant pain: Secondary | ICD-10-CM | POA: Insufficient documentation

## 2019-02-28 NOTE — Patient Instructions (Addendum)
If you are age 30 or older, your body mass index should be between 23-30. Your Body mass index is 30.42 kg/m. If this is out of the aforementioned range listed, please consider follow up with your Primary Care Provider.  If you are age 84 or younger, your body mass index should be between 19-25. Your Body mass index is 30.42 kg/m. If this is out of the aformentioned range listed, please consider follow up with your Primary Care Provider.   Your provider has requested that you go to the basement level of [facility] on [address] [place], [place]  for lab work as soon as possible. Press "B" on the elevator. The lab is located at the first door on the left as you exit the elevator.  We have sent the following medications to your pharmacy for you to pick up at your convenience:  Omeprazole 20 mg 1 daily 30 minutes before meals.   Gastroesophageal Reflux Disease, Adult Gastroesophageal reflux (GER) happens when acid from the stomach flows up into the tube that connects the mouth and the stomach (esophagus). Normally, food travels down the esophagus and stays in the stomach to be digested. With GER, food and stomach acid sometimes move back up into the esophagus. You may have a disease called gastroesophageal reflux disease (GERD) if the reflux:  Happens often.  Causes frequent or very bad symptoms.  Causes problems such as damage to the esophagus. When this happens, the esophagus becomes sore and swollen (inflamed). Over time, GERD can make small holes (ulcers) in the lining of the esophagus. What are the causes? This condition is caused by a problem with the muscle between the esophagus and the stomach. When this muscle is weak or not normal, it does not close properly to keep food and acid from coming back up from the stomach. The muscle can be weak because of:  Tobacco use.  Pregnancy.  Having a certain type of hernia (hiatal hernia).  Alcohol use.  Certain foods and  drinks, such as coffee, chocolate, onions, and peppermint. What increases the risk? You are more likely to develop this condition if you:  Are overweight.  Have a disease that affects your connective tissue.  Use NSAID medicines. What are the signs or symptoms? Symptoms of this condition include:  Heartburn.  Difficult or painful swallowing.  The feeling of having a lump in the throat.  A bitter taste in the mouth.  Bad breath.  Having a lot of saliva.  Having an upset or bloated stomach.  Belching.  Chest pain. Different conditions can cause chest pain. Make sure you see your doctor if you have chest pain.  Shortness of breath or noisy breathing (wheezing).  Ongoing (chronic) cough or a cough at night.  Wearing away of the surface of teeth (tooth enamel).  Weight loss. How is this treated? Treatment will depend on how bad your symptoms are. Your doctor may suggest:  Changes to your diet.  Medicine.  Surgery. Follow these instructions at home: Eating and drinking   Follow a diet as told by your doctor. You may need to avoid foods and drinks such as: ? Coffee and tea (with or without caffeine). ? Drinks that contain alcohol. ? Energy drinks and sports drinks. ? Bubbly (carbonated) drinks or sodas. ? Chocolate and cocoa. ? Peppermint and mint flavorings. ? Garlic and onions. ? Horseradish. ? Spicy and acidic foods. These include peppers, chili powder, curry powder, vinegar, hot sauces, and BBQ sauce. ?  Citrus fruit juices and citrus fruits, such as oranges, lemons, and limes. ? Tomato-based foods. These include red sauce, chili, salsa, and pizza with red sauce. ? Fried and fatty foods. These include donuts, french fries, potato chips, and high-fat dressings. ? High-fat meats. These include hot dogs, rib eye steak, sausage, ham, and bacon. ? High-fat dairy items, such as whole milk, butter, and cream cheese.  Eat small meals often. Avoid eating large  meals.  Avoid drinking large amounts of liquid with your meals.  Avoid eating meals during the 2-3 hours before bedtime.  Avoid lying down right after you eat.  Do not exercise right after you eat. Lifestyle   Do not use any products that contain nicotine or tobacco. These include cigarettes, e-cigarettes, and chewing tobacco. If you need help quitting, ask your doctor.  Try to lower your stress. If you need help doing this, ask your doctor.  If you are overweight, lose an amount of weight that is healthy for you. Ask your doctor about a safe weight loss goal. General instructions  Pay attention to any changes in your symptoms.  Take over-the-counter and prescription medicines only as told by your doctor. Do not take aspirin, ibuprofen, or other NSAIDs unless your doctor says it is okay.  Wear loose clothes. Do not wear anything tight around your waist.  Raise (elevate) the head of your bed about 6 inches (15 cm).  Avoid bending over if this makes your symptoms worse.  Keep all follow-up visits as told by your doctor. This is important. Contact a doctor if:  You have new symptoms.  You lose weight and you do not know why.  You have trouble swallowing or it hurts to swallow.  You have wheezing or a cough that keeps happening.  Your symptoms do not get better with treatment.  You have a hoarse voice. Get help right away if:  You have pain in your arms, neck, jaw, teeth, or back.  You feel sweaty, dizzy, or light-headed.  You have chest pain or shortness of breath.  You throw up (vomit) and your throw-up looks like blood or coffee grounds.  You pass out (faint).  Your poop (stool) is bloody or black.  You cannot swallow, drink, or eat. Summary  If a person has gastroesophageal reflux disease (GERD), food and stomach acid move back up into the esophagus and cause symptoms or problems such as damage to the esophagus.  Treatment will depend on how bad your  symptoms are.  Follow a diet as told by your doctor.  Take all medicines only as told by your doctor. This information is not intended to replace advice given to you by your health care provider. Make sure you discuss any questions you have with your health care provider. Document Revised: 08/12/2017 Document Reviewed: 08/12/2017 Elsevier Patient Education  2020 [employer].  Food Choices for Gastroesophageal Reflux Disease, Adult When you have gastroesophageal reflux disease (GERD), the foods you eat and your eating habits are very important. Choosing the right foods can help ease your discomfort. Think about working with a nutrition specialist (dietitian) to help you make good choices. What are tips for following this plan?  Meals  Choose healthy foods that are low in fat, such as fruits, vegetables, whole grains, low-fat dairy products, and lean meat, fish, and poultry.  Eat small meals often instead of 3 large meals a day. Eat your meals slowly, and in a place where you are relaxed. Avoid bending over or  lying down until 2-3 hours after eating.  Avoid eating meals 2-3 hours before bed.  Avoid drinking a lot of liquid with meals.  Cook foods using methods other than frying. Bake, grill, or broil food instead.  Avoid or limit: ? Chocolate. ? Peppermint or spearmint. ? Alcohol. ? Pepper. ? Black and decaffeinated coffee. ? Black and decaffeinated tea. ? Bubbly (carbonated) soft drinks. ? Caffeinated energy drinks and soft drinks.  Limit high-fat foods such as: ? Fatty meat or fried foods. ? Whole milk, cream, butter, or ice cream. ? Nuts and nut butters. ? Pastries, donuts, and sweets made with butter or shortening.  Avoid foods that cause symptoms. These foods may be different for everyone. Common foods that cause symptoms include: ? Tomatoes. ? Oranges, lemons, and limes. ? Peppers. ? Spicy food. ? Onions and garlic. ? Vinegar. Lifestyle  Maintain a healthy  weight. Ask your doctor what weight is healthy for you. If you need to lose weight, work with your doctor to do so safely.  Exercise for at least 30 minutes for 5 or more days each week, or as told by your doctor.  Wear loose-fitting clothes.  Do not smoke. If you need help quitting, ask your doctor.  Sleep with the head of your bed higher than your feet. Use a wedge under the mattress or blocks under the bed frame to raise the head of the bed. Summary  When you have gastroesophageal reflux disease (GERD), food and lifestyle choices are very important in easing your symptoms.  Eat small meals often instead of 3 large meals a day. Eat your meals slowly, and in a place where you are relaxed.  Limit high-fat foods such as fatty meat or fried foods.  Avoid bending over or lying down until 2-3 hours after eating.  Avoid peppermint and spearmint, caffeine, alcohol, and chocolate. This information is not intended to replace advice given to you by your health care provider. Make sure you discuss any questions you have with your health care provider. Document Revised: 05/27/2018 Document Reviewed: 03/11/2016 Elsevier Patient Education  [employer].   Due to recent changes in healthcare laws, you may see the results of your imaging and laboratory studies on MyChart before your provider has had a chance to review them.  We understand that in some cases there may be results that are confusing or concerning to you. Not all laboratory results come back in the same time frame and the provider may be waiting for multiple results in order to interpret others.  Please give [place] 48 hours in order for your provider to thoroughly review all the results before contacting the office for clarification of your results.   Please call the office around the end of January to schedule a 6 week follow up  Thank you for choosing [facility] Noralyn Pick, CRNP

## 2019-03-07 NOTE — Addendum Note (Signed)
Addended by: Shellia Cleverly on: 03/07/2019 08:20 AM   Modules accepted: Level of Service
